# Patient Record
Sex: Male | Born: 1970 | Race: White | Hispanic: No | Marital: Single
Health system: Southern US, Community
[De-identification: ages and names within clinical notes are randomized; demographics above are authoritative.]

---

## 2007-02-27 ENCOUNTER — Emergency Department: Payer: Self-pay | Admitting: Emergency Medicine

## 2007-04-24 ENCOUNTER — Inpatient Hospital Stay (HOSPITAL_COMMUNITY): Admission: EM | Admit: 2007-04-24 | Discharge: 2007-05-03 | Payer: Self-pay | Admitting: Emergency Medicine

## 2007-05-21 ENCOUNTER — Inpatient Hospital Stay (HOSPITAL_COMMUNITY): Admission: RE | Admit: 2007-05-21 | Discharge: 2007-05-24 | Payer: Self-pay | Admitting: Orthopedic Surgery

## 2007-06-04 ENCOUNTER — Inpatient Hospital Stay (HOSPITAL_COMMUNITY): Admission: RE | Admit: 2007-06-04 | Discharge: 2007-06-07 | Payer: Self-pay | Admitting: Orthopedic Surgery

## 2008-05-12 ENCOUNTER — Ambulatory Visit (HOSPITAL_COMMUNITY): Admission: RE | Admit: 2008-05-12 | Discharge: 2008-05-13 | Payer: Self-pay | Admitting: Orthopedic Surgery

## 2008-06-12 IMAGING — CT CT EXTREM LOW W/O CM*R*
3 series · 16 of 33 positions shown, 19 images · IV contrast (agent unspecified)
Comparison: none

CLINICAL DATA: Fell 30 feet.  Fracture with surgical intervention twice.  
CT OF THE RIGHT TIBIA/FIBULA WITHOUT CONTRAST:
TECHNIQUE: Multidetector CT imaging was performed according to the standard protocol.  Multiplanar CT image reconstructions were also generated.

[Series 3: lowextremity 2.0 b60s · axial · 0.40mm/px · z∈[-1102,-724]mm · 8 of 225 slices shown, 10 images]
[im 18/225  soft-tissue]
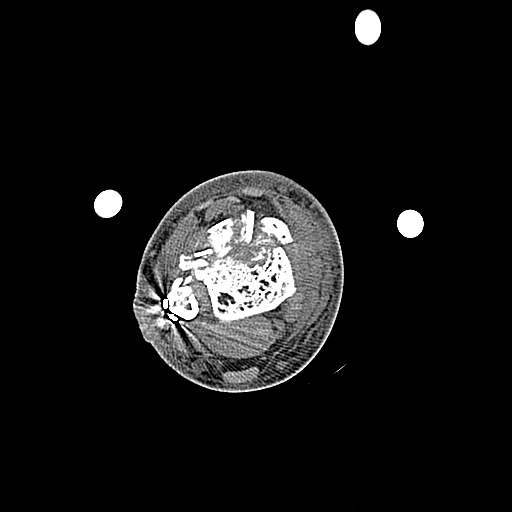
[im 18/225  bone]
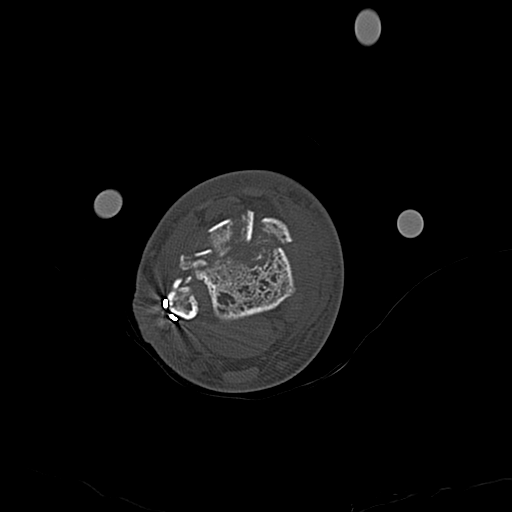
[im 52/225  bone]
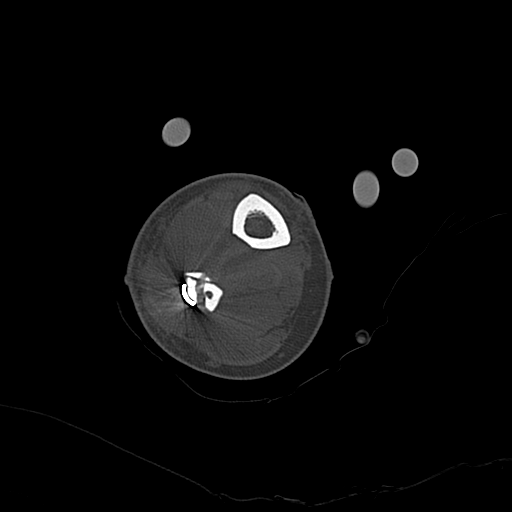
[im 69/225  bone]
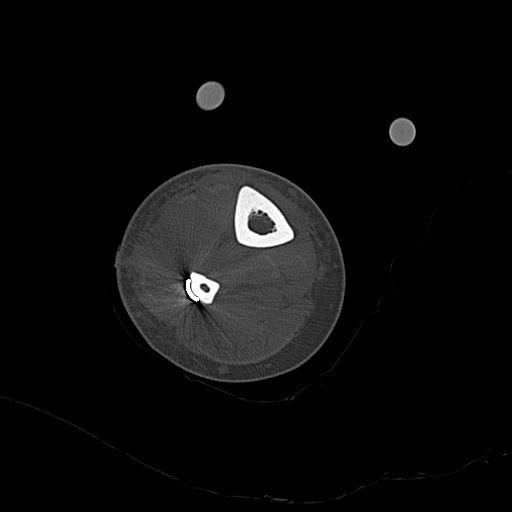
[im 104/225  bone]
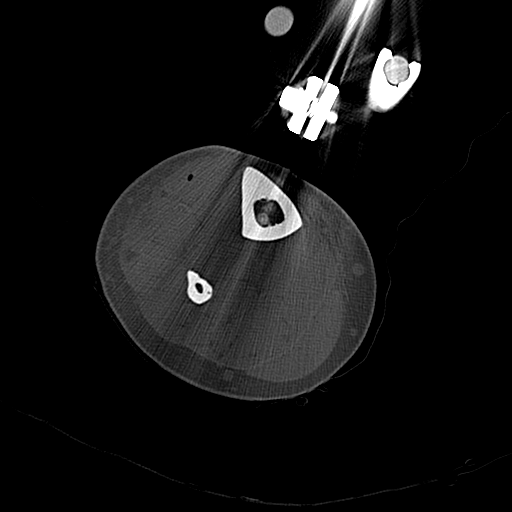
[im 121/225  soft-tissue]
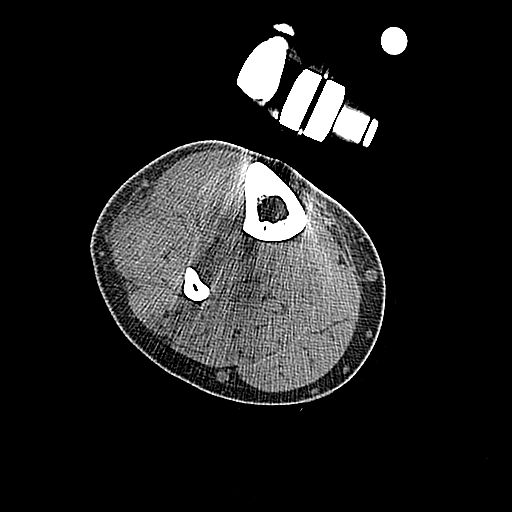
[im 121/225  bone]
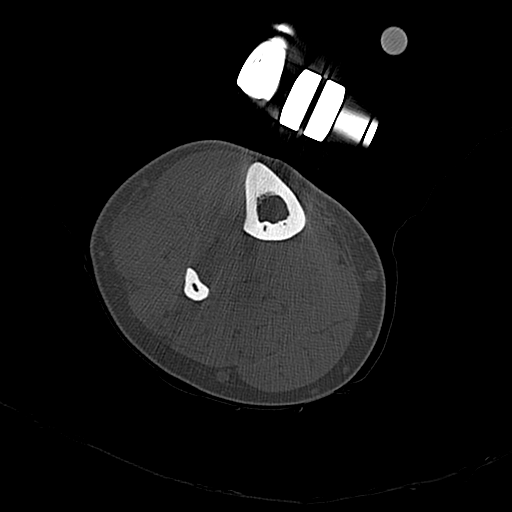
[im 156/225  bone]
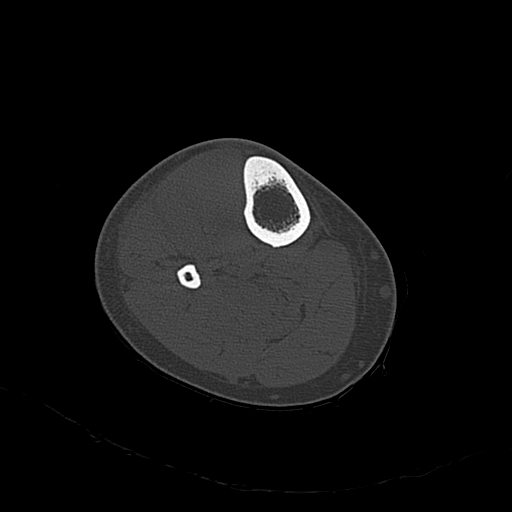
[im 173/225  bone]
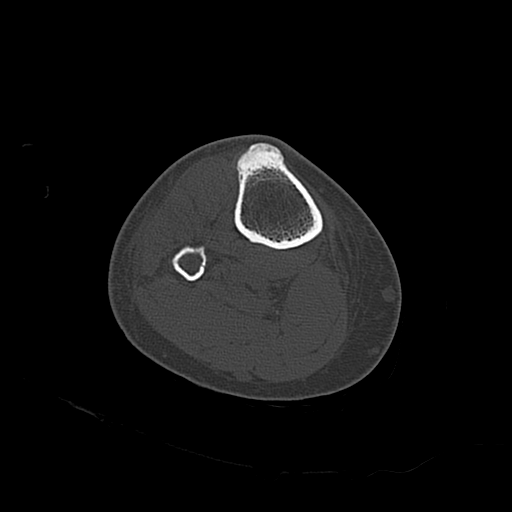
[im 207/225  bone]
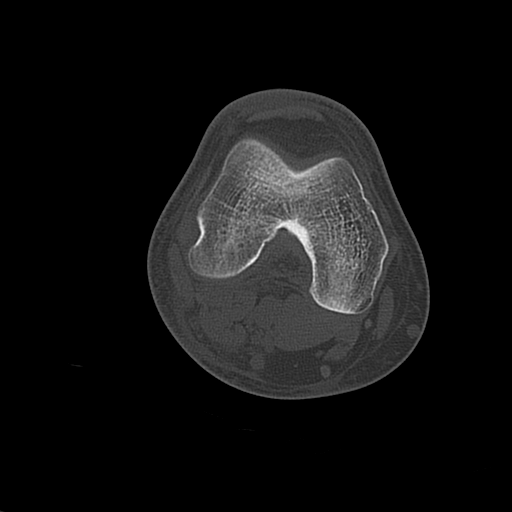

[Series 5: lowextremity 2.0 spo · coronal · 0.88mm/px · 3 of 64 slices shown]
[im 13/64  bone]
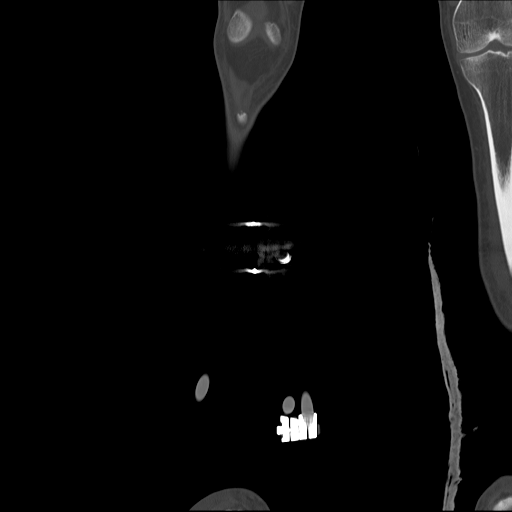
[im 26/64  bone]
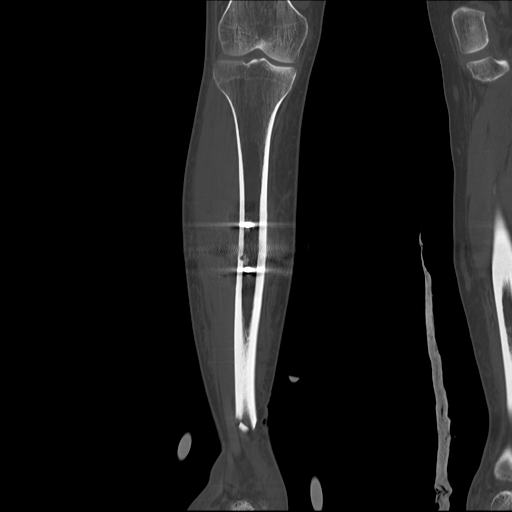
[im 38/64  bone]
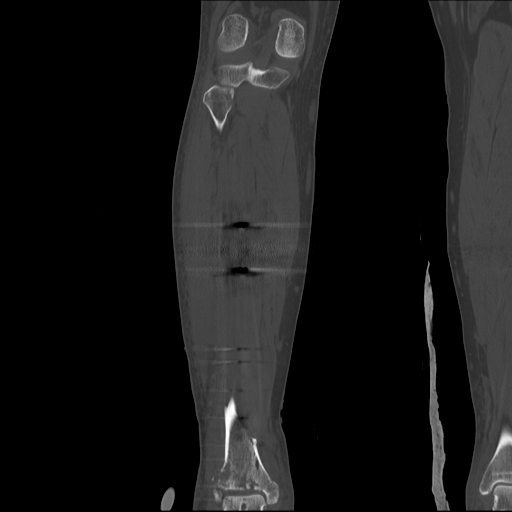

[Series 6: lowextremity 1.0 spo · sagittal · 0.88mm/px · 5 of 243 slices shown, 6 images]
[im 81/243  bone]
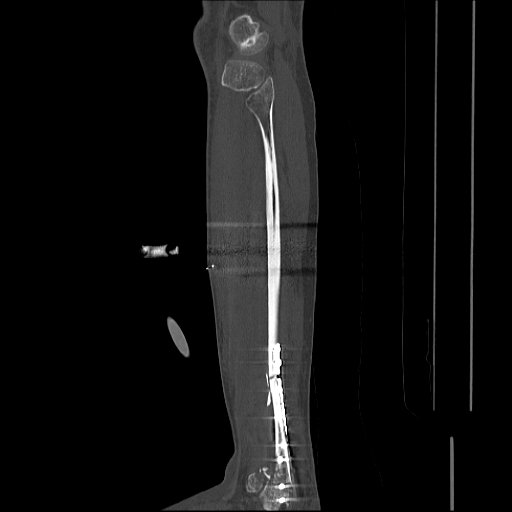
[im 101/243  bone]
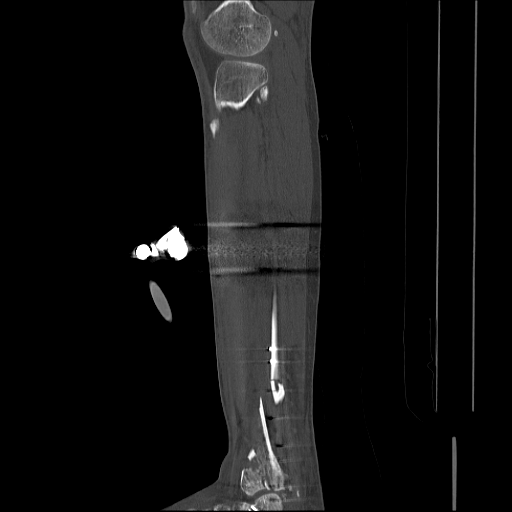
[im 122/243  soft-tissue]
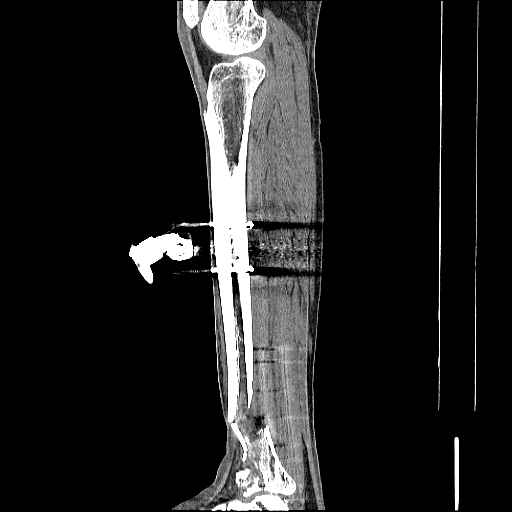
[im 122/243  bone]
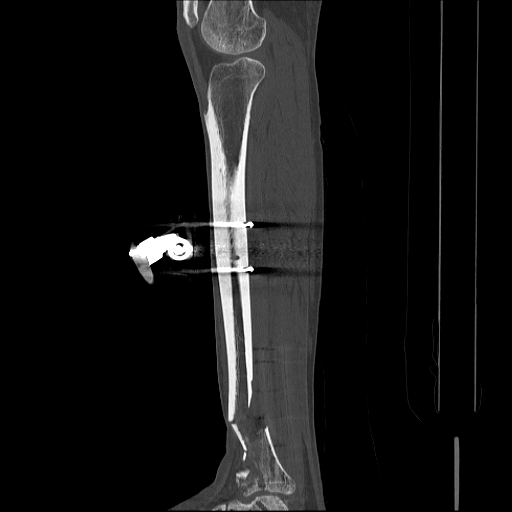
[im 142/243  bone]
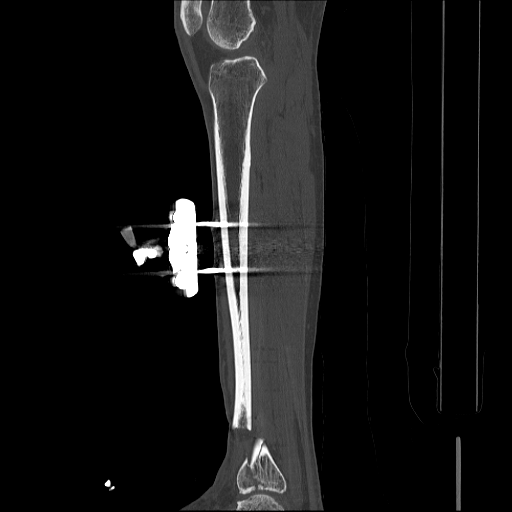
[im 162/243  bone]
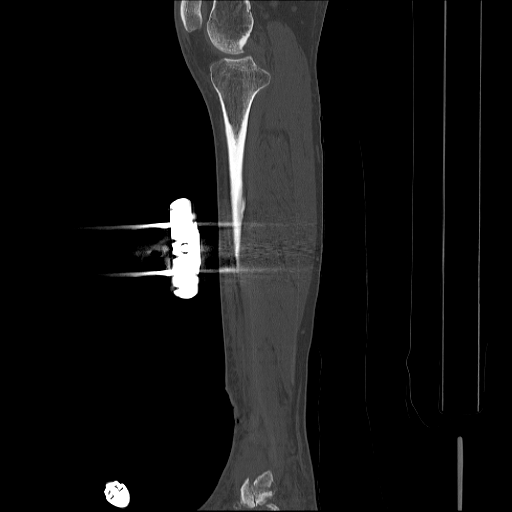

[16 of 33 positions shown; findings below may reference images not displayed]

FINDINGS: A long sideplate and screws have been utilized to transfix distal right fibular shaft fracture.  There is a butterfly fragment which is separate from the main fracture fragments with the largest gap 5.8 mm.  A screw traverses towards this region but does not incorporate this fracture fragment.  
External fixation screws are in place along the mid tibia.  There is tract of an old removed external fixation screw inbetween the 2 screws which are currently present.  The distal tibial shaft comminuted fracture fragments are separated by a full shaft width with the distal tibial fracture components located posteriorly.  There is intraarticular extension of the comminuted distal tibial fracture with incongruity of the articular surface.  The medial malleolus is fractured.  Screw within the talus and there is a tract of a removed screw.    A small fracture is seen along the posterior talus.
IMPRESSION: Markedly comminuted fracture of the distal right tibia with fracture fragments separated by a shaft width.  Please see above discussion.

## 2011-03-21 NOTE — Discharge Summary (Signed)
NAMELINKIN, VIZZINI               ACCOUNT NO.:  [id number]   MEDICAL RECORD NO.:  [id number]          PATIENT TYPE:  INP   LOCATION:  5029                         FACILITY:  [facility]   PHYSICIAN:  Doralee Albino. Carola Frost, M.D. DATE OF BIRTH:  [birth year]   DATE OF ADMISSION:  06/04/2007  DATE OF DISCHARGE:  06/07/2007                               DISCHARGE SUMMARY   DISCHARGE DIAGNOSES:  1. Right pilon fracture.  2. Status post left calcaneus ORIF.  3. Infected right calcaneus pin site.   PROCEDURE PERFORMED:  1. On June 04, 2007, removal of external fixator.  2. Debridement of calcaneus pin site with partial excision.  3. ORIF of right tibial pilon fracture, tibia only.   BRIEF HOSPITAL COURSE:  Mr. Dirocco was then taken to operating room on  June 04, 2007, at which time he underwent procedure described above  without complication.  Postoperatively, he was in a splint and initially  on a PCA for pain control.  He was subsequently switched to oral  medications alone.  He was able to mobilize bed-to-chair with PT.  He  was put on Coumadin as well as Lovenox for DVT prophylaxis.  He was able  to tolerate a regular diet and by August 1 he was deemed stable for  discharge.  On that day, his wound was examined and was noted be clean,  dry and intact with no sensory or motor deficits.   DISCHARGE INSTRUCTIONS:  Mr. Kant is to continue with daily dressing  changes using Adaptic gauze and Ace wrap on the right ankle.  He is to  use the splint.  He is also to continue with active range of motion of  the left foot and ankle including subtalar motion.  He is to contact me  with any problems, concerns or questions.  I will plan to see him back  this Wednesday for a wound check and suture removal on the left side.   DISCHARGE MEDICATIONS:  1. Percocet 5/325 1-2 tablets every 6 h as needed for pain control.      Patient has a left over supply which is adequate.  2. Oxycodone 5 mg one to tablets  every 3 hours in between Percocet      doses as needed for breakthrough pain.      Doralee Albino. Carola Frost, M.D.  Electronically Signed     MHH/MEDQ  D:  06/07/2007  T:  06/07/2007  Job:  [id number]

## 2011-03-21 NOTE — Op Note (Signed)
Terry Floyd, Terry Floyd               ACCOUNT NO.:  [id number]   MEDICAL RECORD NO.:  [id number]          PATIENT TYPE:  INP   LOCATION:  5029                         FACILITY:  [facility]   PHYSICIAN:  Doralee Albino. Carola Frost, M.D. DATE OF BIRTH:  [birth year]   DATE OF PROCEDURE:  06/04/2007  DATE OF DISCHARGE:                               OPERATIVE REPORT   PREOPERATIVE DIAGNOSES:  1. Right tibial pilon fracture.  2. Retained external fixator.   POSTOPERATIVE DIAGNOSES:  1. Right tibial pilon fracture.  2. Retained external fixator.  3. Anterior tibial artery tear.   PROCEDURES:  1. Open reduction internal fixation of right tibial pilon, tibia only.  2. Removal external fixator under anesthesia.  3. Partial excision and curettage of pin infection, calcaneus, infuse      allografting, right tibia.  4. Repair of anterior tibial artery.   SURGEON:  Myrene Galas, M.D.   ASSISTANT:  RNFA, Adrian Blackwater.   ANESTHESIA:  General.   COMPLICATIONS:  None.   TOTAL TOURNIQUET TIME:  An hour and 30 minutes.   ESTIMATED BLOOD LOSS:  100 mL.   DRAINS:  None.   DISPOSITION:  To PACU.   CONDITION:  Stable.   BRIEF SUMMARY AND INDICATIONS FOR PROCEDURE:  Terry Floyd is a 40-year-  old male status post bilateral lower extremity trauma who presents for  definitive treatment of his open right tibia fracture which has been  treated serially with external fixation, adjustment of the external  fixation and internal fixation of his fibula, and with his medial eschar  still not healed.  He now has adequate soft tissue resolution to undergo  lateral plating.  We discussed preoperatively risks and benefits of  surgery including the possibility of infection, nerve injury, vessel  injury, need for further surgery, DVT, PE and loss of range of motion,  pain, arthritis and many others.  We had a full discussion and wished to  proceed.   DESCRIPTION OF PROCEDURE:  Terry Floyd was administered  preoperative  antibiotics and taken to the operating room where general anesthesia was  induced.  His right lower extremity was prepped and draped in usual  sterile fashion.  We did remove the external fixator.  The calcaneus pin  was loosened and did have some associated drainage, particular medially.  Consequently, a formal curettage and partial excision was indicated.  This was performed with the curettes in a sterile fashion.  copious  irrigation and then we placed fresh drapes and also used Ioban to seal  these areas from the rest of the incision.   Standard anterolateral approach was then made through a curvilinear  incision, making sure to keep as much distance between [place] and the medial  eschar as possible.  This skin seemed quite tethered to the skin.  We  did debride some of the eschar later in the case as it elevated off the  skin.  We also elevated this region just super periosteal layer in order  to allow for some mobility of the tissues there.  The patient's  neurovascular bundle was incarcerated within the fracture site.  Because  of the need for maximal visibility, we exsanguinated the extremity with  an Esmarch bandage and elevated the tourniquet.  We were then able to  very carefully and tediously free the bundle which had been in this  position for nearly 6 weeks.  We then were able to obtain reduction of  the metadiaphysis which was secured with a lag screw anteromedial to  posterolateral.  We then selected the appropriate sized plate in order  to create a rafter effect.  The plate was adjusted distally.  We placed  standard lag fixation initially in order to squeeze the articular  surface together again using the CT scanner preoperative template.  We  then placed locked screw and so-called kick-stand screw as well.  Proximally, we placed standard screw to maximally appose the plate to  the bone followed by two locked screws as well.  Given this was an open  fracture  and the patient had bony defects, we used the infuse allograft  which is indicated for open fractures to reduce nonunion and infection  rates.  AP mortise and lateral images showed appropriate reduction and  hardware placement.  We then deflated the tourniquet, and it was seen  that during elevation of the neurovascular bundle from within the  fracture site a tear into the anterior tibial artery had occurred.  We  were able to repair this with a 6-0 Prolene suture, and no further  intervention was required.  The wound was copiously irrigated and closed  in standard layered fashion using 2-0 Vicryl and 3-0 nylon.  Sterile  gently compressive dressing was applied and then a night splint.  The  patient was awakened from anesthesia and transported to PACU in stable  condition.   PROGNOSIS:  Terry Floyd should do well following this procedure, and as  soon as his wound appears stable, he will be allowed unrestricted range  of motion.  We will go ahead and switch him to Coumadin for DVT  prophylaxis.  He will need to maintain, of course, nonweightbearing  until he has adequate healing of his calcaneus to begin weightbearing.      Doralee Albino. Carola Frost, M.D.  Electronically Signed     MHH/MEDQ  D:  06/04/2007  T:  06/05/2007  Job:  [id number]

## 2011-03-21 NOTE — Discharge Summary (Signed)
NAMEFAWZI, MELMAN               ACCOUNT NO.:  [id number]   MEDICAL RECORD NO.:  [id number]          PATIENT TYPE:  INP   LOCATION:  5040                         FACILITY:  [facility]   PHYSICIAN:  Doralee Albino. Carola Frost, M.D. DATE OF BIRTH:  [birth year]   DATE OF ADMISSION:  05/21/2007  DATE OF DISCHARGE:  05/24/2007                               DISCHARGE SUMMARY   DISCHARGE DIAGNOSIS:  Left calcaneus fracture.   ADDITIONAL DIAGNOSES:  Status post  XFIX and ORIF of fibula for right  open pilon fracture.   PROCEDURE PERFORMED:  ORIF of left calcaneus on May 21, 2007.   HOSPITAL COURSE:  Mr. Dack was admitted where he underwent the  procedure described above.  There were no complications.  Postoperatively, drain was monitored.  He did experience acute shortness  of breath and chest pain on postop day #2 and consequently underwent a  CT angio of his chest to evaluate for PE given his prolonged  nonweightbearing status.  The patient remained on Lovenox for DVT  prophylaxis as he has since time of injury.  This was negative for clot  or other significant finding other than mild fatty liver.  The patient's  respiratory symptoms and chest pain resolved without any intervention.  Because of high drain output, the drain was left until postop day #3 at  which time it had only serous output.  His neurovascular function  remained without deficit.  His drain was changed on postop day #2 and  #3, about by time of discharge was clean, dry, intact except for one  spot of mild sanguineous spotting.  The right ankle and pin site care  continued as previously ordered with dry Kerlix wraps.  His pain was  converted to oral medications alone on postop day #2, and he mobilized  bed to chair with PT.   DISCHARGE INSTRUCTIONS:  Mr. Kreuser is to continue with the following  medications:  Oxycodone 5 mg 1-2 tablets every 4 hours as need for pain  control #80 tablets given.  He is to continue on Lovenox 40 mg  subcu  daily for DVT prophylaxis.  He is to maintain nonweightbearing with a  bed to chair transfers.  Dressing changes should be performed on a daily  basis with Adaptic gauze and Kerlix with an Ace wrap for general  compression.  Once there has been no drainage, he can convert to an Ace  wrap only.  Once there has been on drainage for 48 hours, he may shower.  He is to contact my office immediately with any problems, concerns or  questions as increasing redness, tenderness or drainage from the wound.  Will plan to see him back in 10 days for removal of sutures.      Doralee Albino. Carola Frost, M.D.  Electronically Signed     MHH/MEDQ  D:  05/24/2007  T:  05/24/2007  Job:  [id number]

## 2011-03-21 NOTE — H&P (Signed)
Terry Floyd, AMSDEN NO.:  [id number]   MEDICAL RECORD NO.:  [id number]          PATIENT TYPE:  INP   LOCATION:  3018                         FACILITY:  [facility]   PHYSICIAN:  Alvy Beal, MD    DATE OF BIRTH:  05-25-1971   DATE OF ADMISSION:  04/24/2007  DATE OF DISCHARGE:                              HISTORY & PHYSICAL   ADMITTING DIAGNOSIS:  Open distal tibiofibular fracture, right side.  Closed left calcaneus fracture.   HISTORY:  Terry Floyd is a very pleasant, active 40 year old gentleman who was  in the normal state of good, excellent health until he unfortunately  fell 28 feet off of a pole while at work .  He noticed immediate pain  and deformity of both the lower extremities.  He was brought to the ER  by ambulance with an obvious deformity and open fracture with protruding  bone on the right ankle.  As a result, he was appropriately splinted.  The wound was dressed, and x-rays and I was consulted.   PAST MEDICAL/SURGICAL/FAMILY/SOCIAL HISTORY:  Essentially unremarkable  except for a calcaneus fracture on the right side in the past that was  fixed that was status post an ORIF with a single screw.  He is otherwise  healthy.  He has no cardiovascular abnormalities.  He is on no  medications at home.   CLINICAL EXAM:  He has a grade 2 open distal tibial fracture with  obvious deformity.  He does have capillary refill less than 2 seconds in  all digits on the right lower extremity, and he is moving all toes on  that right lower extremity.  Sensation to light touch is intact.   He has an obvious deformity.  It is difficult to palpate his dorsalis  pedis and posterior tibialis pulses, but I do have a faint posterior tib  pulse.  On the left side, he has an obvious swelling and deformity of  the calcaneus.  He has intact pulses in that lower extremity, and he is  grossly neurovascularly intact in that lower extremity.  He has no knee  pain or hip pin with  palpation or range of motion.  No other gross  deformity.   His abdomen is soft and nontender.  He has no shortness of breath or  chest pain.  No other areas or signs of trauma.  He has no significant  back pain with palpation.   IMAGING STUDIES:  X-ray demonstrates a distal tib-fib fracture with most  likely extension into the intraarticular surface of the tibiotalar  joint.  There is obvious deformity and shortening of the tip of the  tibia and fibula.   There is also previous calcaneus fracture fixed with a single screw.  On  the left side, he has an obvious calcaneus fracture with shortening.  It  does not appear to have a distal tib-fib fracture.   At this point in time, because of the open nature of the fracture, we  are going to take him emergently to the OR for an I&D of the open  fracture reduction and then  application of a external fixator.  I have  explained this to the patient.  I have addressed all the risks and  benefits and alternatives of the surgery, which include infection,  bleeding, nerve damage, need for further surgery, ongoing or worse pain,  nonunion, and even amputation.  At this point, I think the  best course of action is to address the open fracture and then to apply  bulky splint to the left side.  I will consult Dr. Carola Frost for definitive  management of both calcaneus fracture and the distal tib-fib fracture  after it has been temporarily stabilized.      Alvy Beal, MD  Electronically Signed     DDB/MEDQ  D:  04/24/2007  T:  04/24/2007  Job:  [id number]

## 2011-03-21 NOTE — Op Note (Signed)
NAME:  Terry Floyd, Terry Floyd               ACCOUNT NO.:  [id number]   MEDICAL RECORD NO.:  [id number]          PATIENT TYPE:  INP   LOCATION:  2550                         FACILITY:  [facility]   PHYSICIAN:  Alvy Beal, MD    DATE OF BIRTH:  [birth year]   DATE OF PROCEDURE:  DATE OF DISCHARGE:                               OPERATIVE REPORT   PREOPERATIVE DIAGNOSES:  1. Grade II open distal tibia-fibula fracture, intra-articular      extension, right side.  2. Closed calcaneal fracture, left side.   POSTOPERATIVE DIAGNOSES:  1. Grade II open distal tibia-fibula fracture, intra-articular      extension, right side.  2. Closed calcaneal fracture, left side.   PROCEDURE PERFORMED:  1. Emergent incision and debridement of the right open grade II distal      tibia-fibula fracture.  2. Application of Synthes external fixator frame.  3. Reduction and splinting of the left calcaneus fracture.   COMPLICATIONS:  None.   POSTOPERATIVE CONDITION:  Stable.   HISTORY:  This is a very pleasant 40 year old gentleman who was at work  today working on top of a pole.  He fell approximately 28 feet and  landed on his feet.  He had an immediate deformity of the right lower  extremity and bilateral foot and ankle pain.  He was brought to the  emergency room, diagnosed with an open distal fibula fracture and so an  orthopedic consultation was requested.  After discussing treatment  options, we elected to take him to the operating room for stabilization  and I-and-D of his wound.   All appropriate risks, benefits and alternatives were discussed and  consent was obtained.   DESCRIPTION OF PROCEDURE:  The patient was brought to the operating room  and placed supine on the operating table.  After the successful  induction of general anesthesia and endotracheal intubation, the right  lower extremity was prepped and draped in the standard fashion.  At this  point in time we deformed the leg and brought the  bone edges back out  through the open incision on the medial side.  A curet was used to  debride any loose bone fragments and we debrided the soft tissues around  the bone.  We then irrigated copiously with a total of 9 liters of  normal saline and about 500 mL of Bactrim.  The patient tolerated this  well.  The calf remained soft and pliable with no evidence of any  increased compartment pressure.  With the wound properly irrigated and  debrided, I loosely reapproximated the skin edges in order to place soft  tissue over the exposed bone.  This was done with a 2-0 Prolene  horizontal mattress suture.   At this point I then palpated the inferior medial aspect of the tibia,  made two small incisions, and advanced two Synthes half-pin screws to  obtain just beyond bicortical purchase.  The patient had a previous  calcaneus fusion and there was a 6.5 cannulated screw in place there.  And so I elected to put a calcaneus pin just inferior to that  pin.  I  then placed this under fluoroscopic guidance, confirming a trajectory in  both the AP and lateral planes.  I then placed a single metatarsal pin  in the first metatarsal as well to keep the foot into equinus position.  At this point with all pin sites checked with AP and lateral  fluoroscopy, I then secured a Synthes external fixator frame.  The  fracture was brought out to length.  Varus and valgus were corrected.  There was some bone loss noted; however, on the x-ray, I noted the  fibula was out to length and that the fracture was grossly aligned in  the AP and lateral planes.  I then locked the external fixator and the  foot was placed into a neutral position.  At this point the skin edges  were cleaned of all dried blood, and then dry dressings were placed at  the pin sites.   At this point with the right side debrided and stabilized, I then placed  a Quincy Simmonds bulky dressing on the left lower extremity and placed a  thick posterior  splint with side struts and an ACE wrap over that for  temporary stabilization of the calcaneus fracture.   I have already spoken to Dr. Carola Frost about definitive management of both  fractures.  We will admit the patient for pain control, observation, and  discharge him later on, bed to chair only, nonweightbearing bilateral  lower extremities, with max assist.      Alvy Beal, MD  Electronically Signed     DDB/MEDQ  D:  04/24/2007  T:  04/25/2007  Job:  [id number]

## 2011-03-21 NOTE — Op Note (Signed)
Terry Floyd, Terry Floyd               ACCOUNT NO.:  [id number]   MEDICAL RECORD NO.:  [id number]          PATIENT TYPE:  INP   LOCATION:  5013                         FACILITY:  [facility]   PHYSICIAN:  Doralee Albino. Carola Frost, M.D. DATE OF BIRTH:  07/20/1971   DATE OF PROCEDURE:  04/28/2007  DATE OF DISCHARGE:                               OPERATIVE REPORT   PREOPERATIVE DIAGNOSIS:  Open right pilon fracture of tibia and fibula.   POSTOPERATIVE DIAGNOSIS:  Open right pilon fracture of tibia and fibula.   PROCEDURE:  1. ORIF of tibial pilon, fibula only.  2. External fixator adjustment right tibial pilon.  3. Repeat irrigation and debridement of open tibia fracture including      bone.   SURGEON:  Doralee Albino. Carola Frost, M.D.   ASSISTANT:  None.   ANESTHESIA:  General.   SPECIMENS:  None.   ESTIMATED BLOOD LOSS:  150 mL.   COMPLICATIONS:  None.   TOURNIQUET:  None.   DISPOSITION:  To PACU.   CONDITION:  Stable.   INDICATIONS FOR PROCEDURE:  The patient is a 40 year old male who fell  over 30 feet onto his right ankle.  He was initially seen, evaluated by  Dr. Shon Baton who performed an I&D of this open fracture with application  of a spanning external fixator frame.  Subsequent x-rays demonstrated  shortening and significant malalignment of the distal fibula.  I  discussed with the patient adjustment of the external fixator and  possible IM rodding of the fibula in order to restore appropriate length  and rotation. In addition to a repeat irrigation and debridement of the  open fracture.  The patient strongly wished to proceed.  After  discussion of the risk and benefits, risks of which included wound  breakdown, infection, nerve injury, vessel injury, malunion, nonunion,  arthritis, need for further surgery and others.   SUMMARY OF PROCEDURE:  The patient remained on Ancef and gentamicin for  antibiotic coverage of his open fracture.  The patient was taken to the  operating room where  general anesthesia was induced.  His right lower  extremity was prepped, draped usual sterile fashion with a very thorough  scrub performed of his external fixator.  We reopened the old wound then  used another 3000 mL of saline directly on the fracture ends to  facilitate debridement, removal of foreign material.  We did not  identify any large pieces of foreign material. The periosteum extended  well down to the tip of the fracture on the proximal tibia.  There were  a few small pieces of cortical bone fragments which were very small  which were debrided and removed along with some other fascia and muscle,  subcutaneous tissue.   We then turned our attention to the fibula where we made stab incision  distally and attempted to perform a stabilization of procedure of the  fibula.  We were unable to obtain reduction of the fibula in a closed  manner at the distal fragment plus there was some severe comminution  that prevented really significant purchase in that fragment despite our  hope  that it would simply hold this area over.  Consequently we then  converted to an open osteosynthesis to regain appropriate length and  rotation.  We identified the superficial peroneal nerve and did retract  that superiorly, anteriorly and we did not perform any stripping or  direct elevation of the periosteum off the distal fibula fragments.  Again it was severely comminuted but we were able to obtain a nice  buttress against this with regard to which should prevent migration.  Proximally there was some significant comminution as well and we were  able to the span that. We used a longest available one-third tubular  plate and obtained two standard cortices proximally and then two locked.  Good fixation in the interval segment bridging the comminuted area  proximally placing single locked screw near the superior edge of the  segmental shaft.  Distally we placed two locked screws into the severely   comminuted distal segment and I also used suture augmentation for some  of these fragments that appeared to be attached to the syndesmotic  ligaments anteriorly.  Multiple C-arm images confirmed appropriate  reduction of this severely comminuted fracture with restoration of  length alignment.  We then turned our attention to the tibia where we  were able to recover appropriate length and alignment.  The reduction  maneuver did require some force and distraction and we were careful not  to over-distract the joint.  We did accept some mild apex anterior  angulation at the metadiaphysis.  Varus-valgus alignment appeared  appropriate.  Wounds were irrigated once more. The medial side closed  with 2-0 PDS and 3-0 nylon as well as 2-0 nylon far-near near-far  sutures leaving plenty of room of egress drainage.  On the lateral side  we also performed a layered closure with 0-0 and 2-0 Vicryl followed by  2-0 and 3-0 nylon.  Sterile gently compressive dressing was applied and  Ace wraps.  The patient was then awakened from anesthesia, transported  to PACU in stable condition after placement of a Foley catheter.   PROGNOSIS:  The patient has had severe injury to his right tibia.  Today  was important step in terms of realignment and regaining appropriate  length. At this time his skin appears to be holding but he did have some  blisters medially and remains at elevated risk for wound problems,  infection and also nonunion.  May be wise to repeat his CT scan now that  he has a better reduction to see what the best option will be regarding  plating versus conversion to a hybrid or similar.  His gentamicin will  be discontinued and Ancef will be continued for another three doses  only. He will also remain on Lovenox for DVT prophylaxis.      Doralee Albino. Carola Frost, M.D.  Electronically Signed     MHH/MEDQ  D:  04/28/2007  T:  04/28/2007  Job:  [id number]

## 2011-03-21 NOTE — Op Note (Signed)
[name], [name]               ACCOUNT NO.:  [id number]   MEDICAL RECORD NO.:  [id number]          PATIENT TYPE:  INP   LOCATION:  5040                         FACILITY:  [facility]   PHYSICIAN:  Doralee Albino. Carola Frost, M.D. DATE OF BIRTH:  10-Apr-1971   DATE OF PROCEDURE:  05/21/2007  DATE OF DISCHARGE:                               OPERATIVE REPORT   PREOPERATIVE DIAGNOSIS:  Left calcaneus fracture.   POSTOPERATIVE DIAGNOSIS:  Left calcaneus fracture.   PROCEDURE:  Open reduction internal fixation of left calcaneus.   SURGEON:  Myrene Galas, M.D.   ASSISTANT:  Adrian Blackwater, RNFA   ANESTHESIA:  General.   SPECIMENS:  None.   ESTIMATED BLOOD LOSS:  80 mL.   COMPLICATIONS:  None.   TOTAL TOURNIQUET TIME:  128 minutes.   DRAINS:  One.   DISPOSITION:  To PACU condition stable.   BRIEF SUMMARY OF INDICATIONS FOR PROCEDURE:  Terry Floyd is a 40-year-  old male who sustained bilateral lower extremity fractures, as  previously noted that.  We discussed at length the risks and benefits of  surgical fixation of his left calcaneus fracture.  After allowing for  adequate soft tissue swelling resolution.  He understood these  complications to include infection, nerve injury, vessel injury,  malunion, nonunion, need further surgery, loss of range of motion,  arthritis, possibility of infection, flap necrosis and long-term  infection which could result in below-knee amputation among others.  He  also understood the risk of DVT, PE and other perioperative  complications.  Following this discussion he wished to proceed.   DESCRIPTION OF PROCEDURE:  Terry Floyd is administered preop  antibiotics, taken to operating room where general anesthesia was  induced.  He was positioned left side up using hip positioners and  padding all prominences.  The left lower extremity was prepped, draped,  usual sterile fashion with a tourniquet about the thigh.  After marking  the incision, leg was  elevated then exsanguinated with an Esmarch  bandage.  Tourniquet was inflated 300 mmHg.  We made a standard lateral  extensile approach using a subperiosteal dissection.  There was  extensive lateral wall blowout which did increase difficulty of this  exposure.  We were able to do so without any injury to the peroneal  tendons which were elevated up.  Three K-wires were then placed into the  distal fibula, talus and cuboid in order to retract soft tissue flap  atraumatically.  We were meticulous in our handling of this flap as we  did not wish to have any marginal necrosis or injury.  The lateral wall  was removed, subtalar joint block identified and extricated.  This  consisted of one enormous block which measured nearly 2 x 2 cm.  We had  considerable difficulty, reestablishing appropriate length to the hind  foot and eliminating the varus.  We placed a Bennett on the  sustentacular fragment and used distraction against tuberosity while  pulling with a traction pin placed through the calcaneus pushing up on  the first metatarsal head to reduce the varus.  We did augment this with  the lamina spreader as well and after doing so were finally able to  reduce the subtalar joint sustentacular fragment and then protect that  position of the tuberosity with a K-wire.  This left a large void in the  triangular space and we did not have adequate bone for filling this.  We  then corrected the angle of Gissane and reduced the anterior tuberosity  which was fractured in the sagittal plane.  These were held with 0.045 K-  wires.  We then checked our reduction after placing multiple K-wires and  were pleased with the results.   We then applied the DePuy large perimeter plate.  We did accept the  slightly anterior positioning and truncated the most anterior portion of  the plate as it did tend to extend just beyond the joint surface but  this provided excellent support along the subtalar joint.  We  placed two  lag screws over-drilling the near cortex in the sustentaculum and then  several tuberosity screws, two anterior process screws that were placed  within the plate and one outside close to the articular surface.  We  were able to obtain excellent subtalar range of motion after removal of  the pins.  Multiple fluoro images confirmed appropriate reduction,  hardware placement and elimination of hindfoot varus, restoration  bowlers angle, congruity of the subtalar joint and appropriate heel  height with a good reduction of the anterior process as well.  At that  time we did deflate the tourniquet while holding pressure, obtained  hemostasis with electrocautery and then performed a flap closure using 2-  0 Vicryl for the deep layer and 3-0 nylon for the skin.  We did place a  deep drain, sterile gently compressive dressing was applied and then a  night splint.  The patient was awakened from anesthesia and transported  to PACU in stable condition.   PROGNOSIS:  Terry Floyd had a severe calcaneus fracture but we were able  to restore the parameters associated with a positive outcome.  We are  hopeful he will be able to maintain his subtalar range of motion which  intraoperatively appeared quite good and will not go on to early  arthritis at the subtalar joint.  In the event that he does certainly  results of the subtalar fusion should be a very positive as again the  anatomy has been restored for his calcaneus.  He remains at high risk  for DVT and PE.  He will need to undergo definitive internal fixation of  his right open pilon fracture and the next 2 to 4 weeks.  As his wound  heals he will be allowed unrestricted range of motion of the left foot  to include subtalar motion.      Doralee Albino. Carola Frost, M.D.  Electronically Signed     MHH/MEDQ  D:  05/21/2007  T:  05/22/2007  Job:  [id number]
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
## 2011-03-21 NOTE — Op Note (Signed)
NAME:  Terry Floyd, Terry Floyd               ACCOUNT NO.:  [id number]   MEDICAL RECORD NO.:  [id number]          PATIENT TYPE:  OIB   LOCATION:  5004                         FACILITY:  [facility]   PHYSICIAN:  Doralee Albino. Carola Frost, M.D. DATE OF BIRTH:  09/28/1971   DATE OF PROCEDURE:  05/12/2008  DATE OF DISCHARGE:                               OPERATIVE REPORT   PREOPERATIVE DIAGNOSIS:  Right tibia nonunion.   POSTOPERATIVE DIAGNOSIS:  Right tibia nonunion.   PROCEDURE:  Iliac crest bone grafting and repair of right tibial  nonunion.   SURGEON:  Doralee Albino. Carola Frost, MD   ASSISTANT:  Mearl Latin, PA   ANESTHESIA:  General, Dr. Gelene Mink.   TOURNIQUET:  None.   SPECIMENS:  Two debris from the fracture site sent to microbiology.   DRAINS:  None.   DISPOSITION:  PACU.   CONDITION:  Stable.   INDICATIONS FOR PROCEDURE:  Terry Floyd is a 40 year old white male  status post bilateral lower extremity fractures who had spanning  external fixation followed by ORIF.  He went on to develop a nonunion  and this failed to resolve with a prolonged ultrasound of bone  stimulation and protected weightbearing and other conservative measures.  This was confirmed by CT and plain film which showed complete failure to  progress.  There was no loosening of the hardware, however.  We  discussed preoperative risks and benefits of surgery including the  possibility of infection, nerve injury, vessel injury, need for further  surgery, DVT, PE, and many others.  After full discussion, he wished to  proceed.  Of note, his laboratory studies including CRP were negative  for infection preoperatively and not elevated at all with a sed rate of  5.   DESCRIPTION OF PROCEDURE:  Terry Floyd was administered preop  antibiotics consisting of Ancef and taken to operating room where  general anesthesia was induced.  His right lower extremity was prepped  and draped in usual sterile fashion.  A bump was placed around the  right  hip to prop up the iliac crest.  We began distally reopening his  anterior incision over the course of about 5 cm.  We carried the  dissection to the surgical scar down the subcutaneous tissues and  developed the interval over to the medial aspect of the bone.  We  meticulously had developed this interval to stay as deep as possible and  used a Cobb to mobilize the skin and subcutaneous tissues which were  completely tethered just proximal to the fracture site.  Once we  mobilized this tissue and adequately developed our interval, a 15 blade  was placed into the defect and nonunion site anteriorly.  We then used a  series of 15 blade curettes and rongeur to remove the fibrous tissue  which had developed within the nonunion site getting back to bone.  The  bone did appear somewhat sclerotic at the margins of the nonunion site.  The cavity was considerably larger than anticipated but we were able to  get back to bone tissue.  We then packed this wound and returned  our  attention to the proximal aspect of the leg.   A 5-cm incision was made directly over the iliac crest and dissection  carried through a muscular plane down to the anterior edge of the crest.  We used a electrocautery to elevate the edges and made a trapdoor such  that this could be repaired after harvest.  We used the Kupfner gouges.  After harvesting at least 10 mL of iliac crest graft, we then placed  Gelfoam in between the tables after irrigating thoroughly and then  closed the trapdoor repairing the deep fascia with a #1 figure-of-eight  Vicryl, shallow subcu with 2-0 Vicryl, and the skin with a running  Prolene suture and Steri-Strips.   Distally, I packed the iliac crest into the nonunion site.  Prior to  doing so, I did use 25 drill to drill cephalad and caudal into the  segments of bone and in hopes of restoring some flow into the medullary  cavity.  We were able to completely fill this space with the  autogenous  graft.  We took final AP mortise images which showed the defect quite  nicely.  Prior to placing the graft, we did irrigate thoroughly.  After  placing the graft, we closed with 2-0 Vicryl and a 3-0 nylon simple  sutures.  Sterile gently compressive dressing was applied and posterior  stirrup splint.   Montez Morita, PA assisted throughout the procedure with retraction of the  soft tissues during exposure and debridement of the nonunion site with  simultaneous closure of the iliac crest bone site proximally greatly  expediting the case and was necessary to its completion.   PROGNOSIS:  Terry Floyd will be nonweightbearing over the next 5-6 weeks  with graduated weightbearing thereafter.  Once his wound is completely  healed, he will be allowed to swim and ambulate in the pool.  He will  continue to use the bone stimulator for assistance.  He will be on DVT  prophylaxis while in the hospital but we would not anticipate the need  to continue once discharged.  He will be kept overnight for pain  control.      Doralee Albino. Carola Frost, M.D.  Electronically Signed     MHH/MEDQ  D:  05/12/2008  T:  05/13/2008  Job:  [id number]
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
## 2011-08-03 LAB — DIFFERENTIAL
Basophils Relative: 1
Eosinophils Absolute: 0.2
Monocytes Absolute: 0.6
Monocytes Relative: 8
Neutro Abs: 3.8

## 2011-08-03 LAB — CBC
HCT: 41
HCT: 43.5
Hemoglobin: 14.8
MCHC: 34.1
MCHC: 35
MCV: 92.1
Platelets: 266
RDW: 13.5
RDW: 13.7

## 2011-08-03 LAB — BASIC METABOLIC PANEL
BUN: 10
CO2: 25
CO2: 29
Chloride: 102
GFR calc non Af Amer: >60
Glucose, Bld: 110 — ABNORMAL HIGH
Glucose, Bld: 145 — ABNORMAL HIGH
Potassium: 3.9
Potassium: 4.6
Sodium: 136

## 2011-08-03 LAB — ANAEROBIC CULTURE

## 2011-08-03 LAB — WOUND CULTURE
Culture: NO GROWTH
Gram Stain: NONE SEEN

## 2011-08-21 LAB — PROTIME-INR
INR: 1
INR: 1
Prothrombin Time: 13.1
Prothrombin Time: 15.8 — ABNORMAL HIGH

## 2011-08-21 LAB — BASIC METABOLIC PANEL
BUN: 5 — ABNORMAL LOW
BUN: 9
CO2: 30
CO2: 30
Calcium: 9.4
Calcium: 9.4
Creatinine, Ser: 0.87
GFR calc Af Amer: >60
GFR calc non Af Amer: >60
Glucose, Bld: 117 — ABNORMAL HIGH
Glucose, Bld: 125 — ABNORMAL HIGH

## 2011-08-21 LAB — CBC
HCT: 35.3 — ABNORMAL LOW
MCHC: 34.2
MCHC: 34.6
MCV: 88.1
Platelets: 314
RDW: 13.3
RDW: 13.7

## 2011-08-22 LAB — CBC
HCT: 40.3
Hemoglobin: 13.6
MCHC: 33.7
RBC: 4.49

## 2011-08-23 LAB — URINALYSIS, ROUTINE W REFLEX MICROSCOPIC
Ketones, ur: 15 — AB
Nitrite: NEGATIVE
Protein, ur: NEGATIVE

## 2011-08-23 LAB — CBC
HCT: 30.2 — ABNORMAL LOW
HCT: 31 — ABNORMAL LOW
Hemoglobin: 10.5 — ABNORMAL LOW
Hemoglobin: 10.8 — ABNORMAL LOW
Hemoglobin: 14.5
Platelets: 243
Platelets: 270
Platelets: 289
RDW: 13.1
RDW: 13.1
RDW: 13.5
WBC: 11.2 — ABNORMAL HIGH

## 2011-08-23 LAB — SAMPLE TO BLOOD BANK

## 2011-08-23 LAB — I-STAT 8, (EC8 V) (CONVERTED LAB)
BUN: 14
Bicarbonate: 24.8 — ABNORMAL HIGH
Hemoglobin: 15.3
Operator id: 234501
Potassium: 4.2
Sodium: 140
TCO2: 26

## 2011-08-23 LAB — PROTIME-INR: INR: 1

## 2011-08-23 LAB — BASIC METABOLIC PANEL
BUN: 9
GFR calc non Af Amer: >60
Potassium: 4.7
Sodium: 137

## 2020-07-07 ENCOUNTER — Other Ambulatory Visit: Payer: Self-pay

## 2020-07-07 ENCOUNTER — Emergency Department
Admission: EM | Admit: 2020-07-07 | Discharge: 2020-07-07 | Disposition: A | Discharge status: Home / Self Care | Payer: BC Managed Care – PPO | Attending: Emergency Medicine | Admitting: Emergency Medicine

## 2020-07-07 ENCOUNTER — Encounter: Payer: Self-pay | Admitting: Emergency Medicine

## 2020-07-07 ENCOUNTER — Emergency Department: Payer: BC Managed Care – PPO

## 2020-07-07 DIAGNOSIS — M25511 Pain in right shoulder: Secondary | ICD-10-CM | POA: Diagnosis present

## 2020-07-07 DIAGNOSIS — M7531 Calcific tendinitis of right shoulder: Secondary | ICD-10-CM | POA: Insufficient documentation

## 2020-07-07 MED ORDER — PREDNISONE 20 MG PO TABS: ORAL_TABLET | ORAL | 0 refills | Status: AC

## 2020-07-07 NOTE — Discharge Instructions (Signed)
Call make an appointment Dr. Joice Lofts who is the orthopedist on call if any continued problems with your right shoulder.  Begin taking prednisone 2 tablets once a day for the next 5 days.  You may continue using ice or heat to your shoulder as needed for discomfort.  You may also take Tylenol with the prednisone but at this time avoid taking anti-inflammatories until you have finished the prednisone.  In the future if you begin having shoulder pain begin taking an anti-inflammatory with food to help with inflammation which should make your shoulder feel better quicker.

## 2020-07-07 NOTE — ED Triage Notes (Signed)
Pt reports threw his right shoulder out working around the house Sunday and it has continued to get worse and now cannot lift his right shoulder

## 2020-07-07 NOTE — ED Provider Notes (Signed)
[facility] Emergency Department Provider Note   ____________________________________________   First MD Initiated Contact with Patient 07/07/20 1005     (approximate)  I have reviewed the triage vital signs and the nursing notes.   HISTORY  Chief Complaint Shoulder Pain   HPI Terry Floyd is a 49 y.o. male presents to the ED with complaint of right shoulder pain.  Patient states that he worked around the house for 2 days and then began having pain.  He states that shoulder pain has continued to get worse and now he is unable to lift his right shoulder without moderate difficulty and increase in pain.  Patient has not taken any over-the-counter medication for his shoulder pain.  He denies any known injury to his shoulder but states that he played a lot of sports and he also continues to fish.  Patient is right-hand dominant.  Rates pain as 5 out of 10.     History reviewed. No pertinent past medical history.  There are no problems to display for this patient.   History reviewed. No pertinent surgical history.  Prior to Admission medications   Medication Sig Start Date End Date Taking? Authorizing Provider  predniSONE (DELTASONE) 20 MG tablet Take 2 tablets once a day for 5 days 07/07/20   Tommi Rumps, PA-C    Allergies Patient has no allergy information on record.  No family history on file.  Social History Social History   Tobacco Use  . Smoking status: Not on file  Substance Use Topics  . Alcohol use: Not on file  . Drug use: Not on file    Review of Systems Constitutional: No fever/chills Eyes: No visual changes. Cardiovascular: Denies chest pain. Respiratory: Denies shortness of breath. Gastrointestinal: No abdominal pain.  No nausea, no vomiting.  Musculoskeletal: Positive for right shoulder pain. Skin: Negative for rash. Neurological: Negative for headaches, focal weakness or  numbness. ____________________________________________   PHYSICAL EXAM:  VITAL SIGNS: ED Triage Vitals  Enc Vitals Group     BP 07/07/20 0857 128/78     Pulse Rate 07/07/20 0857 78     Resp 07/07/20 0857 18     Temp 07/07/20 0857 98 F (36.7 C)     Temp Source 07/07/20 0857 Oral     SpO2 07/07/20 0857 98 %     Weight 07/07/20 0834 215 lb (97.5 kg)     Height 07/07/20 0834 5\' 9"  (1.753 m)     Head Circumference --      Peak Flow --      Pain Score 07/07/20 0834 5     Pain Loc --      Pain Edu? --      Excl. in GC? --    Constitutional: Alert and oriented. Well appearing and in no acute distress. Eyes: Conjunctivae are normal. PERRL. EOMI. Head: Atraumatic. Neck: No stridor.   Cardiovascular: Normal rate, regular rhythm. Grossly normal heart sounds.  Good peripheral circulation. Respiratory: Normal respiratory effort.  No retractions. Lungs CTAB. Musculoskeletal: Right shoulder is negative for any gross deformity or skin discoloration.  Motion is restricted in all 4 planes secondary to increased pain.  No crepitus is appreciated with range of motion.  Patient has increased pain with abduction.  Muscle strength is equal bilaterally.  Pulses present. Neurologic:  Normal speech and language. No gross focal neurologic deficits are appreciated. No gait instability. Skin:  Skin is warm, dry and intact.  No abrasions or skin discoloration noted. Psychiatric:  Mood and affect are normal. Speech and behavior are normal.  ____________________________________________   LABS (all labs ordered are listed, but only abnormal results are displayed)  Labs Reviewed - No data to display  RADIOLOGY  Official radiology report(s): DG Shoulder Right  Result Date: 07/07/2020 CLINICAL DATA:  Lateral right shoulder pain 3 days EXAM: RIGHT SHOULDER - 2+ VIEW COMPARISON:  None. FINDINGS: Mineralized densities adjacent to the lateral aspect of the humeral head seen on neutral view most suggestive of  hydroxyapatite deposition within the rotator cuff tendons. No acute fracture. No dislocation. Glenohumeral and acromioclavicular joints are maintained. Soft tissues within normal limits. IMPRESSION: 1. Findings most suggestive of rotator cuff calcific tendinopathy. 2. No acute fracture or dislocation. No significant degenerative change of the right shoulder. Electronically Signed   By: Duanne Guess D.O.   On: 07/07/2020 09:14    ____________________________________________   PROCEDURES  Procedure(s) performed (including Critical Care):  Procedures   ____________________________________________   INITIAL IMPRESSION / ASSESSMENT AND PLAN / ED COURSE  As part of my medical decision making, I reviewed the following data within the electronic MEDICAL RECORD NUMBER Notes from prior ED visits and [place] Controlled Substance Database  Terry Floyd was evaluated in Emergency Department on 07/07/2020 for the symptoms described in the history of present illness. He was evaluated in the context of the global COVID-19 pandemic, which necessitated consideration that the patient might be at risk for infection with the SARS-CoV-2 virus that causes COVID-19. Institutional protocols and algorithms that pertain to the evaluation of patients at risk for COVID-19 are in a state of rapid change based on information released by regulatory bodies including the CDC and federal and state organizations. These policies and algorithms were followed during the patient's care in the ED.  49 year old male presents to the ED with complaint of right shoulder pain after working around the house for 2 days.  There is been no direct trauma to his shoulder.  Is not take any over-the-counter medication for his pain.  X-ray shows a calcific tendinosis in the area of the rotator cuff.  Patient states that he played a lot of sports in college but does not know of an actual injury.  Patient was started on prednisone for the next 5 days.  He  is to follow-up with Dr. Joice Lofts who is on-call for orthopedics if any continued problems or not improving.  He is encouraged to use ice or heat to his shoulder as needed for discomfort.  ____________________________________________   FINAL CLINICAL IMPRESSION(S) / ED DIAGNOSES  Final diagnoses:  Calcific tendinitis of right shoulder     ED Discharge Orders         Ordered    predniSONE (DELTASONE) 20 MG tablet        07/07/20 1112           Note:  This document was prepared using Dragon voice recognition software and may include unintentional dictation errors.    Tommi Rumps, PA-C 07/07/20 1256    Sharyn Creamer, MD 07/07/20 1534
